# Patient Record
Sex: Male | Born: 2000 | Race: Black or African American | Marital: Single | Smoking: Never smoker
Health system: Southern US, Community
[De-identification: ages and names within clinical notes are randomized; demographics above are authoritative.]

## PROBLEM LIST (undated history)

## (undated) DIAGNOSIS — J302 Other seasonal allergic rhinitis: Secondary | ICD-10-CM

---
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
## 2019-12-24 IMAGING — DX DG ABDOMEN ACUTE W/ 1V CHEST
4 series · 4 of 4 positions shown · non-contrast
Comparison: None.

CLINICAL DATA: 17-year-old male with sharp epigastric pain since
last night. Nausea.

EXAM:
DG ABDOMEN ACUTE W/ 1V CHEST

[chest pa]
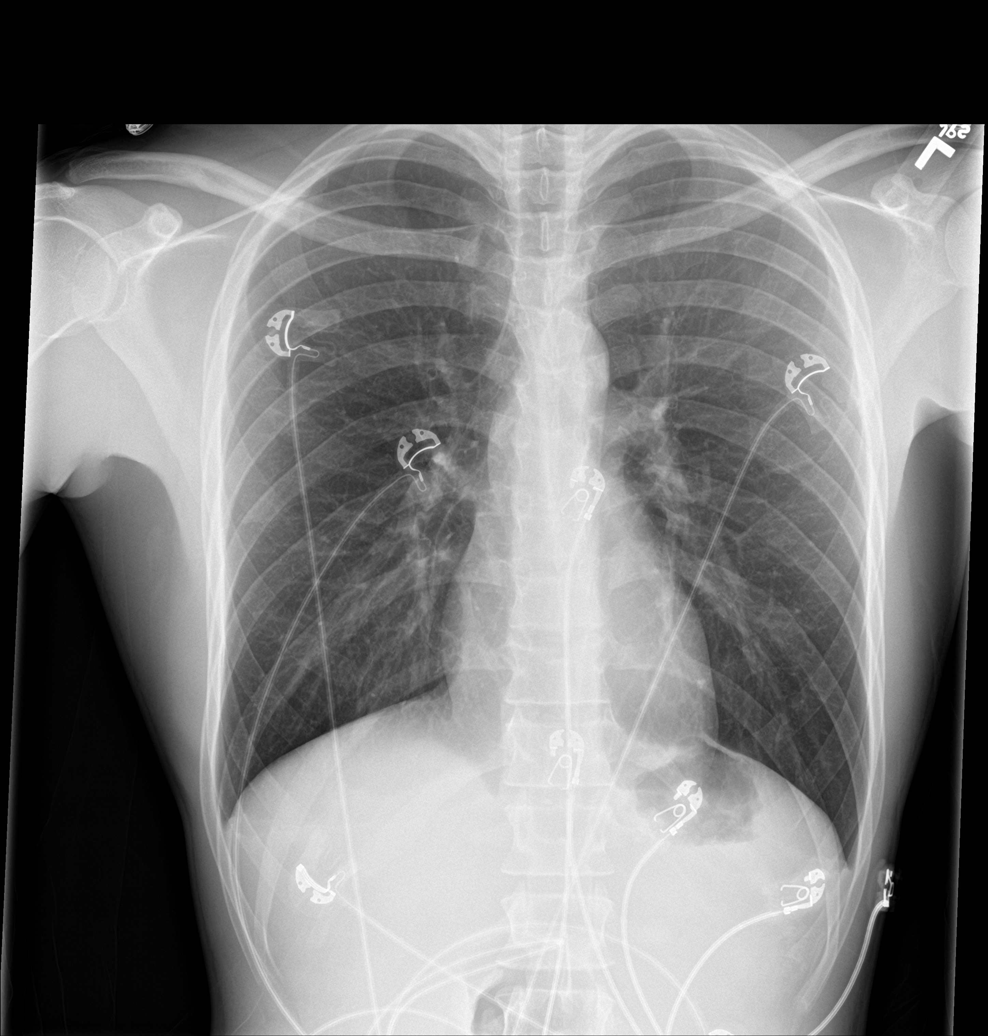

[abdomen erect]
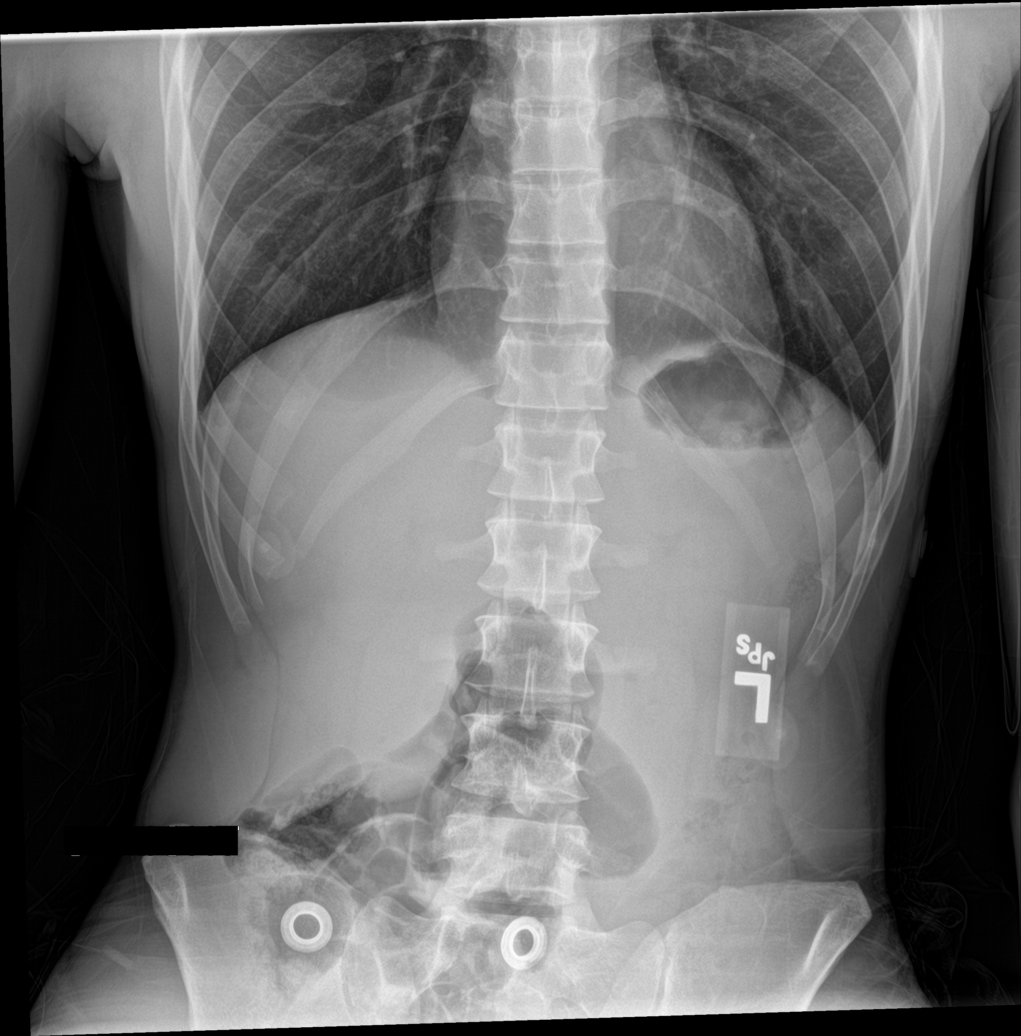

[abdomen supine]
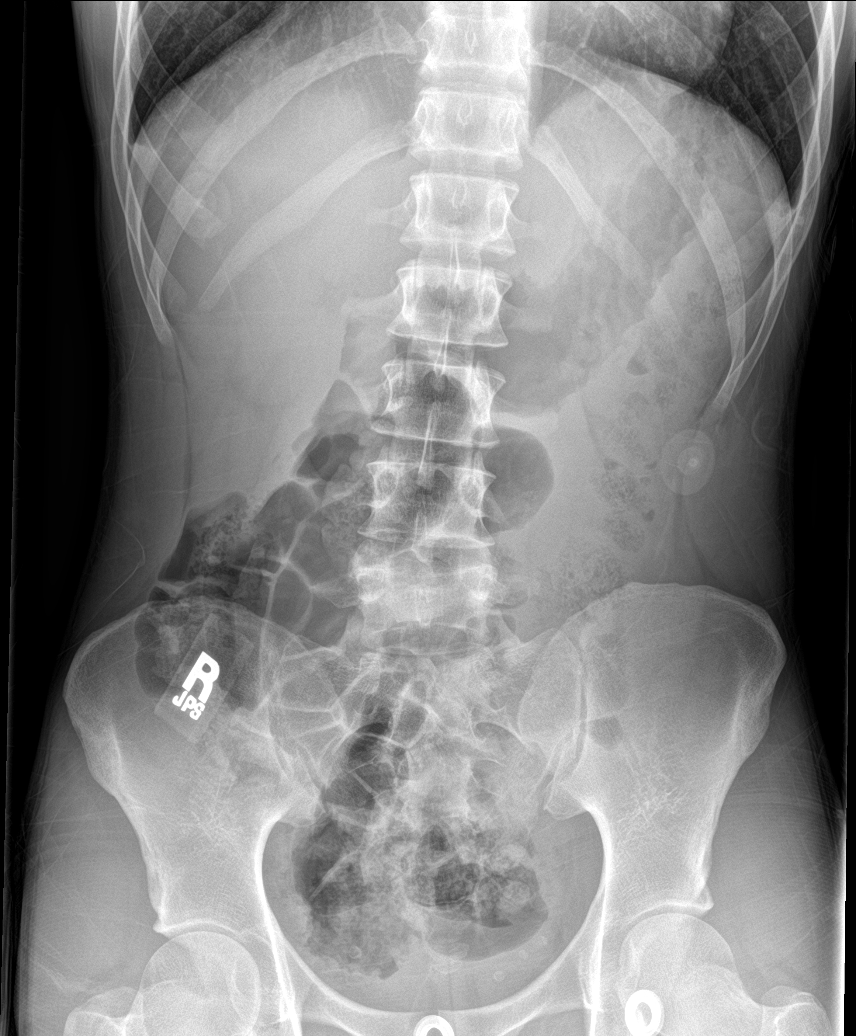

[abdomen decu]
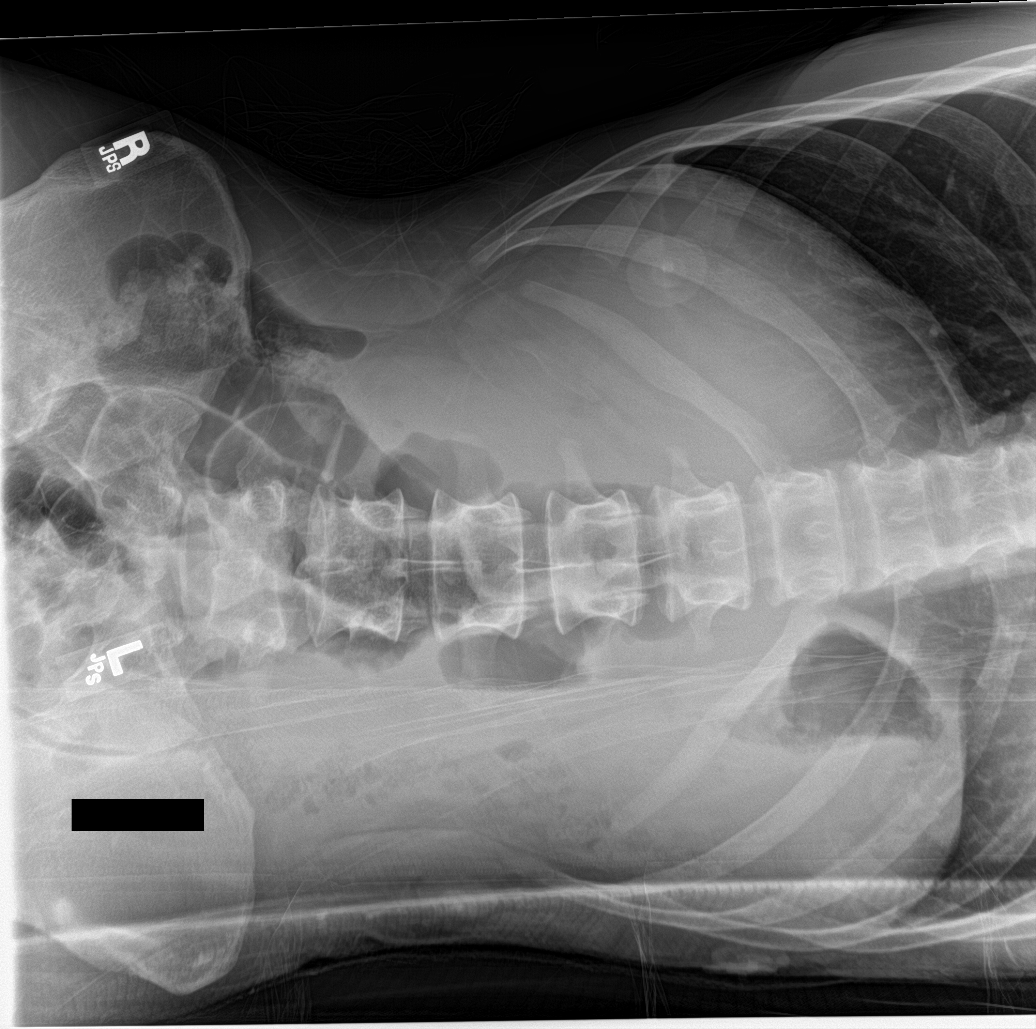

[4 of 4 positions shown; findings below may reference images not displayed]

FINDINGS: PA view of the chest. Normal lung volumes and mediastinal contours.
Visualized tracheal air column is within normal limits. Both lungs
appear clear. No pneumothorax or pneumoperitoneum.

Upright, supine, and left side down lateral decubitus views of the
abdomen. Non obstructed bowel gas pattern. No free air. Abdominal
and pelvic visceral contours are within normal limits. No osseous
abnormality identified. Incidental pelvic phleboliths.
IMPRESSION: 1. Normal bowel gas pattern, no free air.
2. Negative chest.
